# Patient Record
Sex: Female | Born: 2000 | Race: Black or African American | Hispanic: No | Marital: Single | Smoking: Never smoker
Health system: Southern US, Community
[De-identification: ages and names within clinical notes are randomized; demographics above are authoritative.]

---
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
## 2022-11-05 ENCOUNTER — Other Ambulatory Visit: Payer: Self-pay

## 2022-11-05 ENCOUNTER — Ambulatory Visit (HOSPITAL_COMMUNITY)
Admission: EM | Admit: 2022-11-05 | Discharge: 2022-11-05 | Disposition: A | Discharge status: Home / Self Care | Payer: Medicaid Other | Attending: Emergency Medicine | Admitting: Emergency Medicine

## 2022-11-05 ENCOUNTER — Encounter (HOSPITAL_COMMUNITY): Payer: Self-pay | Admitting: *Deleted

## 2022-11-05 DIAGNOSIS — J029 Acute pharyngitis, unspecified: Secondary | ICD-10-CM | POA: Insufficient documentation

## 2022-11-05 LAB — POCT RAPID STREP A, ED / UC: Streptococcus, Group A Screen (Direct): NEGATIVE

## 2022-11-05 LAB — POCT INFECTIOUS MONO SCREEN, ED / UC: Mono Screen: NEGATIVE

## 2022-11-05 MED ORDER — CETIRIZINE HCL 10 MG PO TABS: 10.0000 mg | ORAL_TABLET | Freq: Every day | ORAL | 0 refills | Status: AC

## 2022-11-05 NOTE — Discharge Instructions (Addendum)
You appear to have pharyngitis, irritation of the posterior pharynx.  Your rapid strep and rapid mono were negative in clinic.  We have sent off your strep testing for culture and we will call if it comes back positive.  Please continue symptomatic management with Tylenol and ibuprofen as needed for pain, fevers and discomfort.  You can gargle with warm saline water.  Please follow-up with your primary care or at this clinic if your symptoms do not improve over the next week.  You can trial the allergy medication Zyrtec to help if this is due to seasonal allergies.

## 2022-11-05 NOTE — ED Triage Notes (Signed)
Pt wants to be tested for mon and strep.Pt denies any sore throat and white spots on throat are gone.

## 2022-11-05 NOTE — ED Provider Notes (Signed)
[place]    CSN: [id number] Arrival date & time: 11/05/22  1709      History   Chief Complaint Chief Complaint  Patient presents with   Headache   Fatigue    HPI Cindy Herrera is a 22 y.o. female.   Patient reports around 2 weeks of sore throat.  She states at the beginning she did have white patches, but those have since resolved.  Sore throat is improving.  Reports her mother is requesting strep and mono testing.  Patient denies fevers.  She does endorse headache but feels that this is due to dehydration, as she does not drink enough water.  Denies cough, chest pain, shortness of breath.  Has had mild abdominal discomfort.  The history is provided by the patient.  Headache Associated symptoms: abdominal pain and sore throat   Associated symptoms: no cough, no ear pain, no fatigue and no fever     History reviewed. No pertinent past medical history.  There are no problems to display for this patient.   History reviewed. No pertinent surgical history.  OB History   No obstetric history on file.      Home Medications    Prior to Admission medications   Medication Sig Start Date End Date Taking? Authorizing Provider  cetirizine (ZYRTEC ALLERGY) 10 MG tablet Take 1 tablet (10 mg total) by mouth daily. 11/05/22 12/05/22 Yes Matea Stanard, [place], FNP    Family History History reviewed. No pertinent family history.  Social History Social History   Tobacco Use   Smoking status: Never   Smokeless tobacco: Never  Substance Use Topics   Alcohol use: Not Currently   Drug use: Not Currently     Allergies   Patient has no known allergies.   Review of Systems Review of Systems  Constitutional:  Negative for chills, fatigue and fever.  HENT:  Positive for sore throat. Negative for ear pain.   Eyes:  Negative for discharge.  Respiratory:  Negative for cough and shortness of breath.   Cardiovascular:  Negative for chest pain.  Gastrointestinal:   Positive for abdominal pain.  Neurological:  Positive for headaches.     Physical Exam Triage Vital Signs ED Triage Vitals  Enc Vitals Group     BP 11/05/22 1802 104/67     Pulse Rate 11/05/22 1802 81     Resp 11/05/22 1802 18     Temp 11/05/22 1802 99.7 F (37.6 C)     Temp src --      SpO2 11/05/22 1802 97 %     Weight --      Height --      Head Circumference --      Peak Flow --      Pain Score 11/05/22 1800 0     Pain Loc --      Pain Edu? --      Excl. in [place]? --    No data found.  Updated Vital Signs BP 104/67   Pulse 81   Temp 99.7 F (37.6 C)   Resp 18   LMP 10/22/2022   SpO2 97%   Visual Acuity Right Eye Distance:   Left Eye Distance:   Bilateral Distance:    Right Eye Near:   Left Eye Near:    Bilateral Near:     Physical Exam Vitals and nursing note reviewed.  Constitutional:      General: She is not in acute distress.    Appearance: She  is well-developed.     Comments: Pleasant 22 year old female who appears stated age.  HENT:     Head: Normocephalic and atraumatic.     Mouth/Throat:     Mouth: Mucous membranes are moist.     Pharynx: Uvula midline. Posterior oropharyngeal erythema present. No pharyngeal swelling, oropharyngeal exudate or uvula swelling.     Tonsils: No tonsillar exudate or tonsillar abscesses. 1+ on the right. 1+ on the left.  Eyes:     Conjunctiva/sclera: Conjunctivae normal.  Cardiovascular:     Rate and Rhythm: Normal rate and regular rhythm.     Heart sounds: Normal heart sounds, S1 normal and S2 normal. No murmur heard. Pulmonary:     Effort: Pulmonary effort is normal. No respiratory distress.     Breath sounds: Normal breath sounds.     Comments: Lungs vesicular posteriorly. Abdominal:     Palpations: Abdomen is soft.     Tenderness: There is no abdominal tenderness.  Musculoskeletal:        General: No swelling.     Cervical back: Neck supple.  Lymphadenopathy:     Head:     Right side of head:  Submandibular adenopathy present.     Left side of head: Submandibular adenopathy present.     Cervical: Cervical adenopathy present.  Skin:    General: Skin is warm and dry.     Capillary Refill: Capillary refill takes less than 2 seconds.  Neurological:     Mental Status: She is alert.  Psychiatric:        Mood and Affect: Mood normal.        Behavior: Behavior is cooperative.      UC Treatments / Results  Labs (all labs ordered are listed, but only abnormal results are displayed) Labs Reviewed  CULTURE, GROUP A STREP [facility])  POCT INFECTIOUS MONO SCREEN, ED / UC  POCT RAPID STREP A, ED / UC    EKG   Radiology No results found.  Procedures Procedures (including critical care time)  Medications Ordered in UC Medications - No data to display  Initial Impression / Assessment and Plan / UC Course  I have reviewed the triage vital signs and the nursing notes.  Pertinent labs & imaging results that were available during my care of the patient were reviewed by me and considered in my medical decision making (see chart for details).  Vital signs and nursing notes reviewed, patient is hemodynamically stable.  Appears to have low-grade temperature at 99.7.  2 weeks of pharyngitis, Strep and mono testing obtained in clinic, both were negative.  Throat culture sent.  Uvula midline, low concern for peritonsillar abscess. Symptomatic management discussed.  Patient is not sexually active and does not participate in oral sex, low suspicion for oral gonorrhea.  Return precautions and follow-up care discussed, patient verbalized understanding.     Final Clinical Impressions(s) / UC Diagnoses   Final diagnoses:  Acute pharyngitis, unspecified etiology     Discharge Instructions      You appear to have pharyngitis, irritation of the posterior pharynx.  Your rapid strep and rapid mono were negative in clinic.  We have sent off your strep testing for culture and we will call if it  comes back positive.  Please continue symptomatic management with Tylenol and ibuprofen as needed for pain, fevers and discomfort.  You can gargle with warm saline water.  Please follow-up with your primary care or at this clinic if your symptoms do not improve over the  next week.  You can trial the allergy medication Zyrtec to help if this is due to seasonal allergies.      ED Prescriptions     Medication Sig Dispense Auth. Provider   cetirizine (ZYRTEC ALLERGY) 10 MG tablet Take 1 tablet (10 mg total) by mouth daily. 30 tablet Alizon Schmeling, [place], [place]      I have reviewed the PDMP during this encounter.   Louretta Shorten [place], [place] 11/05/22 [phone]

## 2022-11-06 LAB — CULTURE, GROUP A STREP (THRC)

## 2022-11-07 LAB — CULTURE, GROUP A STREP (THRC)

## 2022-11-08 LAB — CULTURE, GROUP A STREP (THRC)

## 2023-11-28 ENCOUNTER — Ambulatory Visit: Payer: Self-pay

## 2023-11-28 ENCOUNTER — Other Ambulatory Visit: Payer: Self-pay | Admitting: Family Medicine

## 2023-11-28 DIAGNOSIS — Z021 Encounter for pre-employment examination: Secondary | ICD-10-CM
